# Patient Record
Sex: Male | Born: 2000 | Hispanic: Yes | Marital: Single | Smoking: Never smoker
Health system: Southern US, Community
[De-identification: ages and names within clinical notes are randomized; demographics above are authoritative.]

---
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
## 2022-12-15 ENCOUNTER — Ambulatory Visit
Admission: EM | Admit: 2022-12-15 | Discharge: 2022-12-15 | Disposition: A | Discharge status: Home / Self Care | Payer: Self-pay | Attending: Family Medicine | Admitting: Family Medicine

## 2022-12-15 DIAGNOSIS — L600 Ingrowing nail: Secondary | ICD-10-CM

## 2022-12-15 MED ORDER — AMOXICILLIN-POT CLAVULANATE 875-125 MG PO TABS: 1.0000 | ORAL_TABLET | Freq: Two times a day (BID) | ORAL | 0 refills | Status: AC

## 2022-12-15 NOTE — ED Provider Notes (Signed)
[facility]    CSN: [id number] Arrival date & time: 12/15/22  1744      History   Chief Complaint Chief Complaint  Patient presents with   Toe Pain    HPI Jared Johnston is a 22 y.o. male.   HPI  22 year old male with no significant past medical history presents for evaluation of pain in his right great toe.  He states that he has had issues with his right great toe for 1 to 2 years but reports that for the last 2 to 3 months, since he started working, he has been having increased pain.  He reports he has pain if he puts pressure on his toe or if he kneels down and flexes his toe.  He also reports some bloody pus drainage from around the nail.  History reviewed. No pertinent past medical history.  There are no problems to display for this patient.   History reviewed. No pertinent surgical history.     Home Medications    Prior to Admission medications   Medication Sig Start Date End Date Taking? Authorizing Provider  amoxicillin-clavulanate (AUGMENTIN) 875-125 MG tablet Take 1 tablet by mouth every 12 (twelve) hours for 10 days. 12/15/22 12/25/22 Yes Becky Augusta, NP    Family History History reviewed. No pertinent family history.  Social History Social History   Tobacco Use   Smoking status: Never   Smokeless tobacco: Never  Vaping Use   Vaping Use: Never used  Substance Use Topics   Alcohol use: No   Drug use: Never     Allergies   Patient has no known allergies.   Review of Systems Review of Systems  Constitutional:  Negative for fever.  Skin:  Positive for color change. Negative for wound.     Physical Exam Triage Vital Signs ED Triage Vitals  Enc Vitals Group     BP      Pulse      Resp      Temp      Temp src      SpO2      Weight      Height      Head Circumference      Peak Flow      Pain Score      Pain Loc      Pain Edu?      Excl. in GC?    No data found.  Updated Vital Signs BP 115/71   Pulse 65   Temp 98.2  F (36.8 C) (Oral)   Ht 5\' 9"  (1.753 m)   Wt 140 lb (63.5 kg)   SpO2 100%   BMI 20.67 kg/m   Visual Acuity Right Eye Distance:   Left Eye Distance:   Bilateral Distance:    Right Eye Near:   Left Eye Near:    Bilateral Near:     Physical Exam Vitals and nursing note reviewed.  Constitutional:      Appearance: Normal appearance. He is not ill-appearing.  Skin:    General: Skin is warm and dry.     Capillary Refill: Capillary refill takes less than 2 seconds.     Findings: Erythema present. No bruising.  Neurological:     General: No focal deficit present.     Mental Status: He is alert and oriented to person, place, and time.      UC Treatments / Results  Labs (all labs ordered are listed, but only abnormal results are displayed) Labs Reviewed -  No data to display  EKG   Radiology No results found.  Procedures Procedures (including critical care time)  Medications Ordered in UC Medications - No data to display  Initial Impression / Assessment and Plan / UC Course  I have reviewed the triage vital signs and the nursing notes.  Pertinent labs & imaging results that were available during my care of the patient were reviewed by me and considered in my medical decision making (see chart for details).   Patient is a nontoxic-appearing 45 old male presenting for evaluation of pain in the right great toe that is been going on for last 2 to 3 months as outlined in HPI above.  As you can see in the image above, there is some swelling and mild erythema to the medial edge of the right great toenail cuticle.  There is no induration or fluctuance to suggest a paronychia.  There is some bloody drainage along the nail edge and the nail does appear to be growing down into the toe.  I will start the patient on Augmentin 875 twice daily with food for 10 days, have him soak his foot in warm water and Epsom salts to help facilitate drainage, and refer him to [facility] for  further evaluation and management of his infected ingrown toenail.  Final Clinical Impressions(s) / UC Diagnoses   Final diagnoses:  Ingrown nail of great toe of right foot     Discharge Instructions      Take the Augmentin twice daily with food for the infection in your toe.  Soak your foot twice daily in warm water and Epsom salts.  I have referred you to Podiatry for further evaluation and possible removal of your infected ingrown toe nail.     ED Prescriptions     Medication Sig Dispense Auth. Provider   amoxicillin-clavulanate (AUGMENTIN) 875-125 MG tablet Take 1 tablet by mouth every 12 (twelve) hours for 10 days. 20 tablet Becky Augusta, NP      PDMP not reviewed this encounter.   Becky Augusta, NP 12/15/22 [phone]

## 2022-12-15 NOTE — Discharge Instructions (Signed)
Take the Augmentin twice daily with food for the infection in your toe.  Soak your foot twice daily in warm water and Epsom salts.  I have referred you to Podiatry for further evaluation and possible removal of your infected ingrown toe nail.

## 2022-12-15 NOTE — ED Triage Notes (Signed)
Pt presents to UC for toe injury RT toe, pt states he injured it x1-2 years ago.
